# Patient Record
Sex: Male | Born: 1965 | Race: White | Hispanic: No | Marital: Single | Smoking: Current every day smoker
Health system: Southern US, Community
[De-identification: ages and names within clinical notes are randomized; demographics above are authoritative.]

## PROBLEM LIST (undated history)

## (undated) DIAGNOSIS — E039 Hypothyroidism, unspecified: Secondary | ICD-10-CM

## (undated) DIAGNOSIS — E119 Type 2 diabetes mellitus without complications: Secondary | ICD-10-CM

## (undated) DIAGNOSIS — K760 Fatty (change of) liver, not elsewhere classified: Secondary | ICD-10-CM

## (undated) DIAGNOSIS — M545 Low back pain, unspecified: Secondary | ICD-10-CM

## (undated) DIAGNOSIS — Z8719 Personal history of other diseases of the digestive system: Secondary | ICD-10-CM

## (undated) DIAGNOSIS — F410 Panic disorder [episodic paroxysmal anxiety] without agoraphobia: Secondary | ICD-10-CM

## (undated) DIAGNOSIS — K649 Unspecified hemorrhoids: Secondary | ICD-10-CM

## (undated) DIAGNOSIS — S52502A Unspecified fracture of the lower end of left radius, initial encounter for closed fracture: Secondary | ICD-10-CM

## (undated) DIAGNOSIS — E559 Vitamin D deficiency, unspecified: Secondary | ICD-10-CM

## (undated) DIAGNOSIS — J329 Chronic sinusitis, unspecified: Secondary | ICD-10-CM

## (undated) DIAGNOSIS — N400 Enlarged prostate without lower urinary tract symptoms: Secondary | ICD-10-CM

## (undated) DIAGNOSIS — Z903 Acquired absence of stomach [part of]: Secondary | ICD-10-CM

## (undated) DIAGNOSIS — D649 Anemia, unspecified: Secondary | ICD-10-CM

## (undated) DIAGNOSIS — E7800 Pure hypercholesterolemia, unspecified: Secondary | ICD-10-CM

## (undated) DIAGNOSIS — F329 Major depressive disorder, single episode, unspecified: Secondary | ICD-10-CM

## (undated) DIAGNOSIS — T8859XA Other complications of anesthesia, initial encounter: Secondary | ICD-10-CM

## (undated) DIAGNOSIS — T4145XA Adverse effect of unspecified anesthetic, initial encounter: Secondary | ICD-10-CM

## (undated) DIAGNOSIS — F32A Depression, unspecified: Secondary | ICD-10-CM

## (undated) DIAGNOSIS — J302 Other seasonal allergic rhinitis: Secondary | ICD-10-CM

## (undated) DIAGNOSIS — K912 Postsurgical malabsorption, not elsewhere classified: Secondary | ICD-10-CM

## (undated) DIAGNOSIS — E669 Obesity, unspecified: Secondary | ICD-10-CM

## (undated) DIAGNOSIS — G4730 Sleep apnea, unspecified: Secondary | ICD-10-CM

## (undated) DIAGNOSIS — R748 Abnormal levels of other serum enzymes: Secondary | ICD-10-CM

## (undated) DIAGNOSIS — E8809 Other disorders of plasma-protein metabolism, not elsewhere classified: Secondary | ICD-10-CM

## (undated) DIAGNOSIS — K219 Gastro-esophageal reflux disease without esophagitis: Secondary | ICD-10-CM

## (undated) HISTORY — PX: HERNIA REPAIR: SHX51

## (undated) HISTORY — PX: CHOLECYSTECTOMY: SHX55

## (undated) HISTORY — PX: COLONOSCOPY W/ BIOPSIES AND POLYPECTOMY: SHX1376

## (undated) HISTORY — PX: GASTRIC BYPASS: SHX52

## (undated) HISTORY — PX: MULTIPLE TOOTH EXTRACTIONS: SHX2053

---

## 2011-04-18 ENCOUNTER — Other Ambulatory Visit (HOSPITAL_COMMUNITY): Payer: Self-pay | Admitting: Rehabilitation

## 2011-04-18 DIAGNOSIS — M549 Dorsalgia, unspecified: Secondary | ICD-10-CM

## 2011-05-05 ENCOUNTER — Ambulatory Visit (HOSPITAL_COMMUNITY)
Admission: RE | Admit: 2011-05-05 | Discharge: 2011-05-05 | Disposition: A | Discharge status: Home / Self Care | Payer: BC Managed Care – PPO | Source: Ambulatory Visit | Attending: Rehabilitation | Admitting: Rehabilitation

## 2011-05-05 DIAGNOSIS — M549 Dorsalgia, unspecified: Secondary | ICD-10-CM

## 2011-05-19 ENCOUNTER — Ambulatory Visit (HOSPITAL_COMMUNITY)
Admission: RE | Admit: 2011-05-19 | Discharge: 2011-05-19 | Disposition: A | Discharge status: Home / Self Care | Payer: BC Managed Care – PPO | Source: Ambulatory Visit | Attending: Rehabilitation | Admitting: Rehabilitation

## 2011-05-19 DIAGNOSIS — M47817 Spondylosis without myelopathy or radiculopathy, lumbosacral region: Secondary | ICD-10-CM | POA: Insufficient documentation

## 2011-05-19 DIAGNOSIS — M79609 Pain in unspecified limb: Secondary | ICD-10-CM | POA: Insufficient documentation

## 2011-05-19 DIAGNOSIS — M545 Low back pain, unspecified: Secondary | ICD-10-CM | POA: Insufficient documentation

## 2011-05-19 DIAGNOSIS — M5126 Other intervertebral disc displacement, lumbar region: Secondary | ICD-10-CM | POA: Insufficient documentation

## 2011-05-19 IMAGING — XA IR EPIDUROGRAPHY
1 series · 5 of 5 positions shown · non-contrast
Comparison: none

CLINICAL DATA: Lumbosacral spondylosis without myelopathy.  Low
back and left lower extremity pain.  MR demonstrates protrusions
L4, L5 and L5 to S1.

[Series 300: ir epidurography · 5 of 5 slices shown]
[im 1/5]
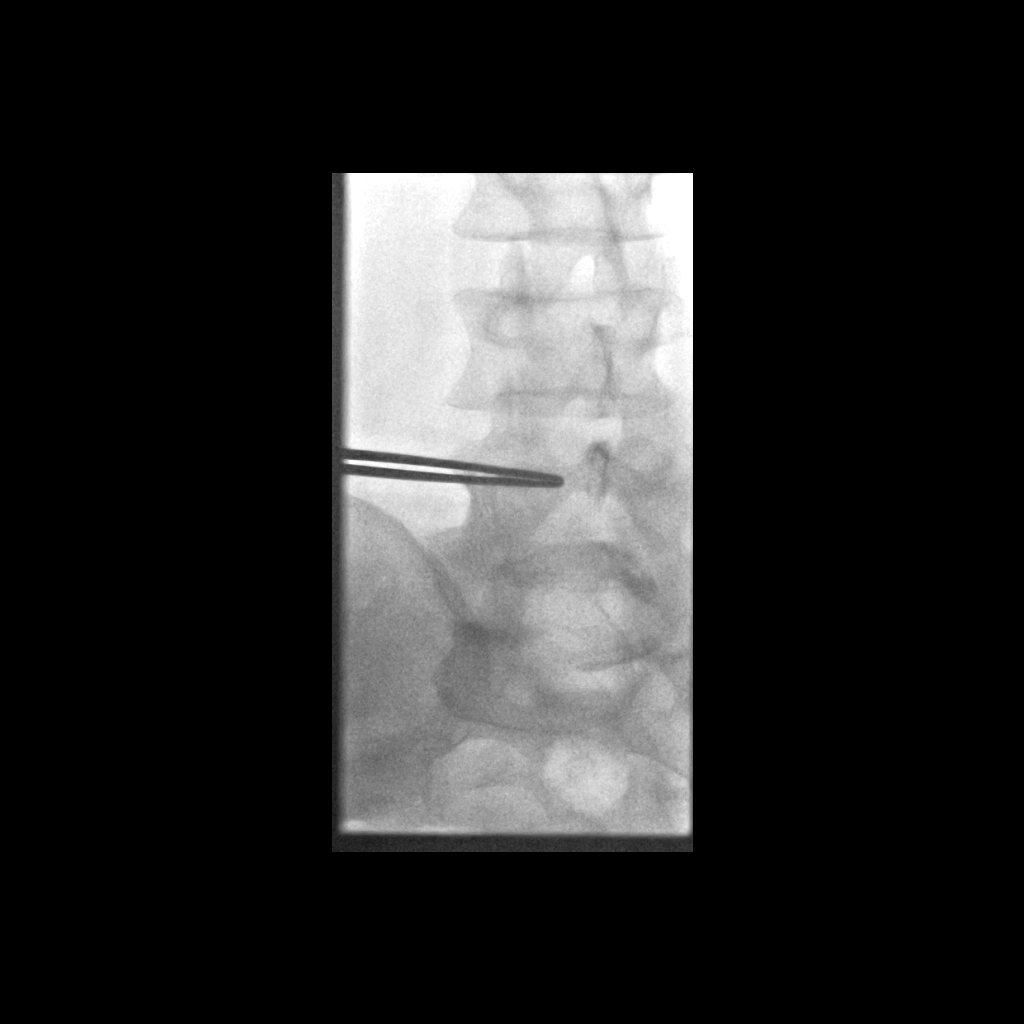
[im 2/5]
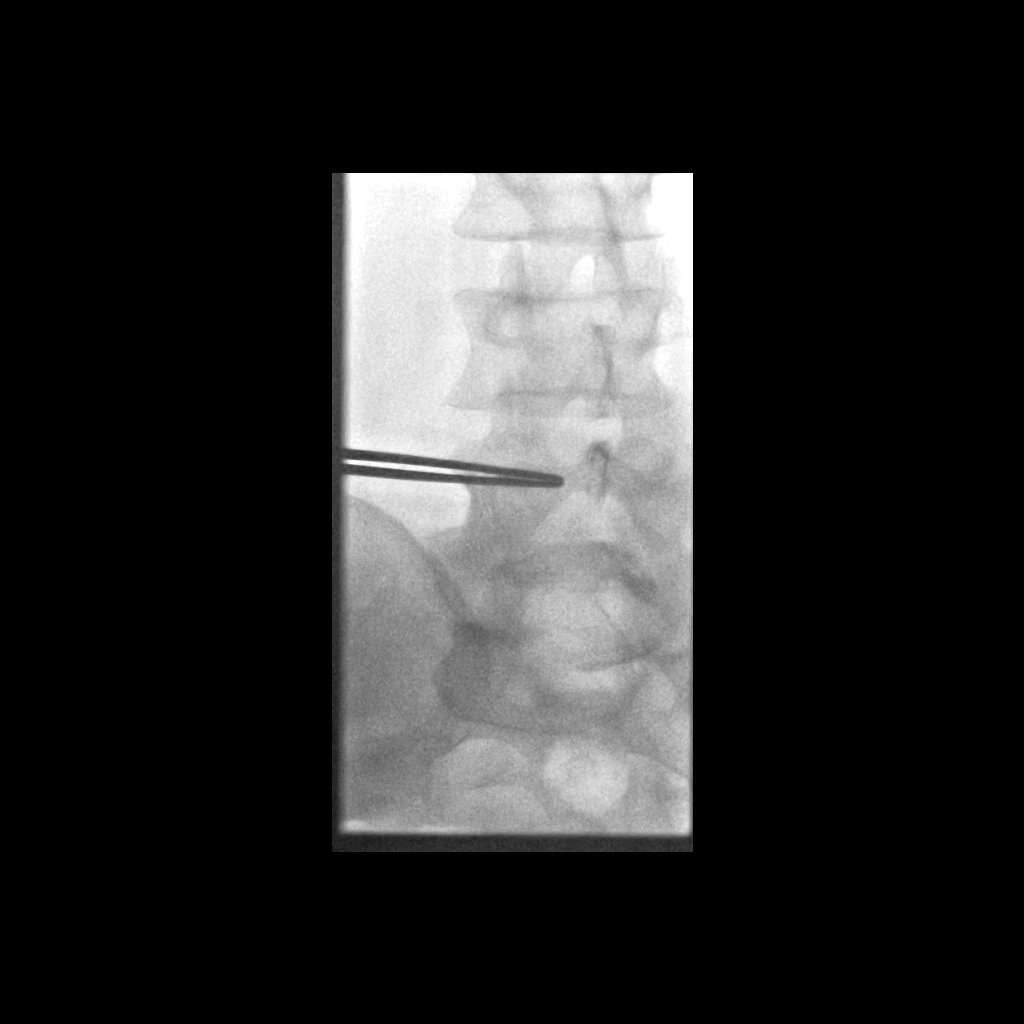
[im 3/5]
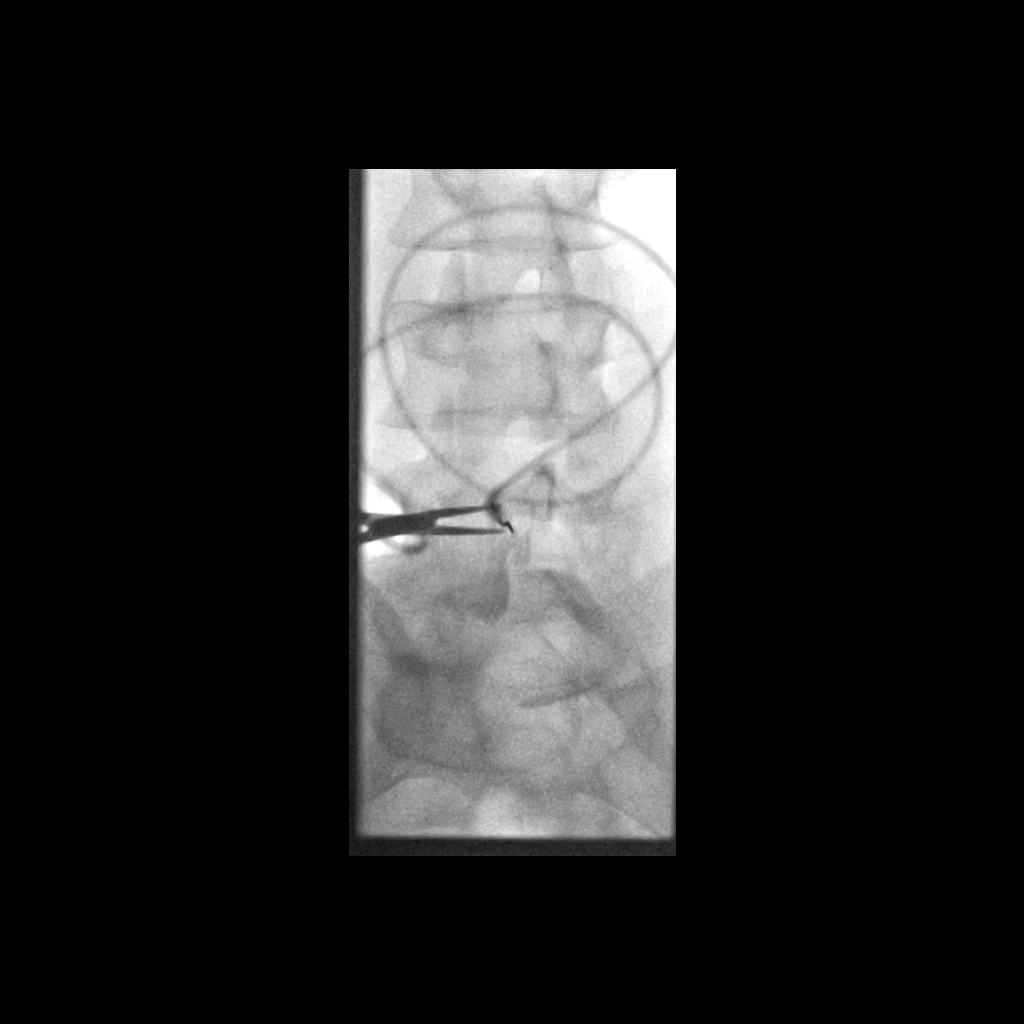
[im 4/5]
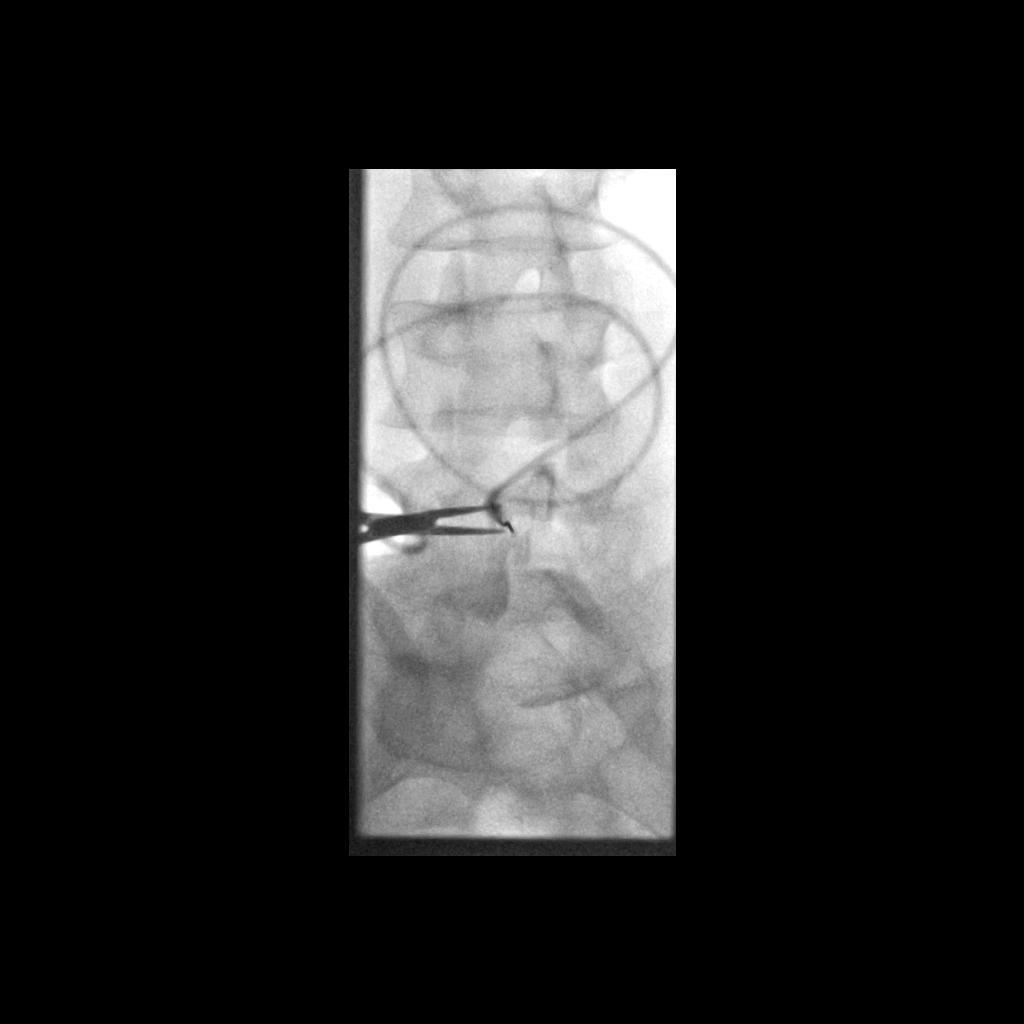
[im 5/5]
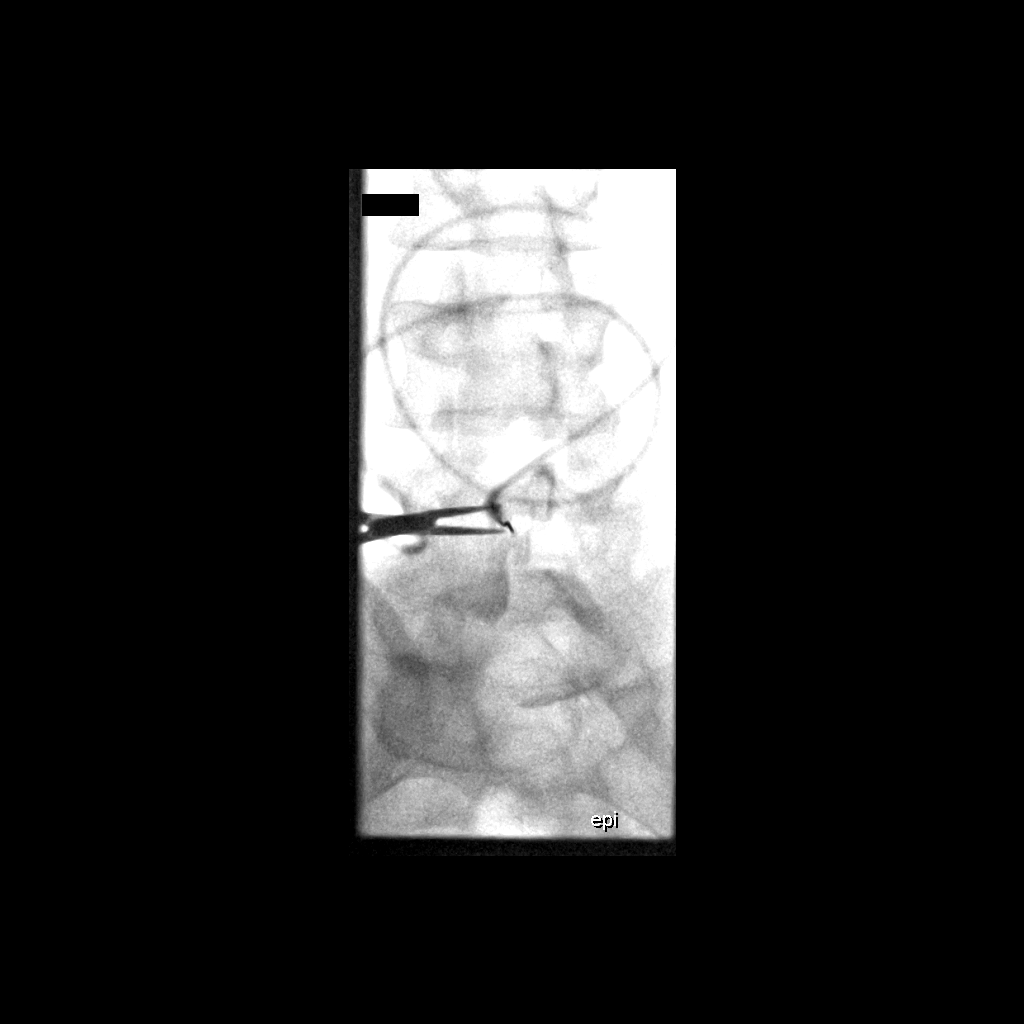

[5 of 5 positions shown; findings below may reference images not displayed]

Procedure: The procedure, risks, benefits, and alternatives were
explained to the patient. Questions regarding the procedure were
encouraged and answered. The patient understands and consents to
the procedure.

LUMBAR EPIDURAL INJECTION: An interlaminar approach was performed
on the left at L4-5.  Operator donned sterile gloves and mask. The
overlying skin was cleansed and anesthetized.  A 20 gauge Crawford
epidural needle was advanced using loss-of-resistance technique.

DIAGNOSTIC EPIDURAL INJECTION: Injection of Omnipaque 180 shows a
good epidural pattern with spread above and below the level of
needle placement. No intrathecal or vascular opacification is seen.

THERAPEUTIC EPIDURAL INJECTION: 120mg of Depo-Medrol mixed with 5ml
lidocaine 1% were instilled.  The procedure was well-tolerated, and
the patient was discharged thirty minutes following the injection
in good condition.

Fluoroscopy Time: 0.5 minutes

Complications: none
IMPRESSION: Technically successful epidural injection on the left
at L4-5.

## 2011-05-19 MED ORDER — IOHEXOL 180 MG/ML  SOLN
20.0000 mL | Freq: Once | INTRAMUSCULAR | Status: AC | PRN
  Administered 2011-05-19: 3 mL via INTRAVENOUS

## 2011-06-06 ENCOUNTER — Other Ambulatory Visit (HOSPITAL_COMMUNITY): Payer: Self-pay | Admitting: Rehabilitation

## 2011-06-10 ENCOUNTER — Other Ambulatory Visit (HOSPITAL_COMMUNITY): Payer: Self-pay | Admitting: Rehabilitation

## 2011-06-10 DIAGNOSIS — M79606 Pain in leg, unspecified: Secondary | ICD-10-CM

## 2011-06-30 ENCOUNTER — Ambulatory Visit (HOSPITAL_COMMUNITY): Admission: RE | Admit: 2011-06-30 | Payer: BC Managed Care – PPO | Source: Ambulatory Visit

## 2017-07-20 ENCOUNTER — Encounter (HOSPITAL_COMMUNITY): Payer: Self-pay | Admitting: *Deleted

## 2017-07-20 ENCOUNTER — Ambulatory Visit: Payer: Self-pay | Admitting: Orthopedic Surgery

## 2017-07-20 NOTE — Progress Notes (Signed)
Pt denies SOB, chest pain, and being under the care of a cardiologist. Pt denies having a cardiac cath but stated that a stress test was performed 10 years ago. Requested echo report from [facility] (in Care Everywhere). Pt made aware to stop taking  Aspirin, vitamins, fish oil, Biotin and herbal medications . Do not take any NSAIDs ie: Ibuprofen, Advil, Naproxen (Aleve), Motrin, BC and Goody Powder or any medication containing Aspirin. Spoke with Lawson Fiscal, Surgical Coordinator to make MD aware that pt has a Penicillin allergy and Ancef is ordered. Pt verbalized understanding of all pre-op instructions.

## 2017-07-20 NOTE — Progress Notes (Signed)
Dr. Amanda Pea called to clarify allergy to ancef and stated that if it's listed as a low severity, continue with Ancef.

## 2017-07-21 ENCOUNTER — Encounter (HOSPITAL_COMMUNITY): Payer: Self-pay | Admitting: *Deleted

## 2017-07-21 ENCOUNTER — Encounter (HOSPITAL_COMMUNITY)
Admission: RE | Disposition: A | Discharge status: Home / Self Care | Payer: Self-pay | Source: Ambulatory Visit | Attending: Orthopedic Surgery

## 2017-07-21 ENCOUNTER — Ambulatory Visit (HOSPITAL_COMMUNITY): Payer: Worker's Compensation | Admitting: Certified Registered"

## 2017-07-21 ENCOUNTER — Observation Stay (HOSPITAL_COMMUNITY)
Admission: RE | Admit: 2017-07-21 | Discharge: 2017-07-22 | Disposition: A | Discharge status: Home / Self Care | Payer: Worker's Compensation | Source: Ambulatory Visit | Attending: Orthopedic Surgery | Admitting: Orthopedic Surgery

## 2017-07-21 DIAGNOSIS — F17210 Nicotine dependence, cigarettes, uncomplicated: Secondary | ICD-10-CM | POA: Insufficient documentation

## 2017-07-21 DIAGNOSIS — K760 Fatty (change of) liver, not elsewhere classified: Secondary | ICD-10-CM | POA: Insufficient documentation

## 2017-07-21 DIAGNOSIS — S52572A Other intraarticular fracture of lower end of left radius, initial encounter for closed fracture: Principal | ICD-10-CM | POA: Insufficient documentation

## 2017-07-21 DIAGNOSIS — Z9884 Bariatric surgery status: Secondary | ICD-10-CM | POA: Diagnosis not present

## 2017-07-21 DIAGNOSIS — E119 Type 2 diabetes mellitus without complications: Secondary | ICD-10-CM | POA: Insufficient documentation

## 2017-07-21 DIAGNOSIS — Z6841 Body Mass Index (BMI) 40.0 and over, adult: Secondary | ICD-10-CM | POA: Diagnosis not present

## 2017-07-21 DIAGNOSIS — X58XXXA Exposure to other specified factors, initial encounter: Secondary | ICD-10-CM | POA: Diagnosis not present

## 2017-07-21 DIAGNOSIS — G4730 Sleep apnea, unspecified: Secondary | ICD-10-CM | POA: Diagnosis not present

## 2017-07-21 DIAGNOSIS — Z880 Allergy status to penicillin: Secondary | ICD-10-CM | POA: Insufficient documentation

## 2017-07-21 DIAGNOSIS — S52502A Unspecified fracture of the lower end of left radius, initial encounter for closed fracture: Secondary | ICD-10-CM | POA: Diagnosis present

## 2017-07-21 DIAGNOSIS — K219 Gastro-esophageal reflux disease without esophagitis: Secondary | ICD-10-CM | POA: Insufficient documentation

## 2017-07-21 DIAGNOSIS — E039 Hypothyroidism, unspecified: Secondary | ICD-10-CM | POA: Diagnosis not present

## 2017-07-21 DIAGNOSIS — S52602A Unspecified fracture of lower end of left ulna, initial encounter for closed fracture: Secondary | ICD-10-CM | POA: Diagnosis not present

## 2017-07-21 DIAGNOSIS — E7800 Pure hypercholesterolemia, unspecified: Secondary | ICD-10-CM | POA: Insufficient documentation

## 2017-07-21 DIAGNOSIS — Z79899 Other long term (current) drug therapy: Secondary | ICD-10-CM | POA: Insufficient documentation

## 2017-07-21 DIAGNOSIS — N400 Enlarged prostate without lower urinary tract symptoms: Secondary | ICD-10-CM | POA: Insufficient documentation

## 2017-07-21 HISTORY — DX: Gastro-esophageal reflux disease without esophagitis: K21.9

## 2017-07-21 HISTORY — DX: Personal history of other diseases of the digestive system: Z87.19

## 2017-07-21 HISTORY — DX: Acquired absence of stomach (part of): Z90.3

## 2017-07-21 HISTORY — DX: Hypothyroidism, unspecified: E03.9

## 2017-07-21 HISTORY — DX: Depression, unspecified: F32.A

## 2017-07-21 HISTORY — DX: Low back pain, unspecified: M54.50

## 2017-07-21 HISTORY — DX: Anemia, unspecified: D64.9

## 2017-07-21 HISTORY — DX: Unspecified fracture of the lower end of left radius, initial encounter for closed fracture: S52.502A

## 2017-07-21 HISTORY — DX: Chronic sinusitis, unspecified: J32.9

## 2017-07-21 HISTORY — DX: Vitamin D deficiency, unspecified: E55.9

## 2017-07-21 HISTORY — DX: Panic disorder (episodic paroxysmal anxiety): F41.0

## 2017-07-21 HISTORY — DX: Benign prostatic hyperplasia without lower urinary tract symptoms: N40.0

## 2017-07-21 HISTORY — DX: Other complications of anesthesia, initial encounter: T88.59XA

## 2017-07-21 HISTORY — DX: Adverse effect of unspecified anesthetic, initial encounter: T41.45XA

## 2017-07-21 HISTORY — DX: Low back pain: M54.5

## 2017-07-21 HISTORY — DX: Unspecified hemorrhoids: K64.9

## 2017-07-21 HISTORY — DX: Major depressive disorder, single episode, unspecified: F32.9

## 2017-07-21 HISTORY — DX: Pure hypercholesterolemia, unspecified: E78.00

## 2017-07-21 HISTORY — DX: Other disorders of plasma-protein metabolism, not elsewhere classified: E88.09

## 2017-07-21 HISTORY — PX: WRIST SURGERY: SHX841

## 2017-07-21 HISTORY — DX: Other seasonal allergic rhinitis: J30.2

## 2017-07-21 HISTORY — DX: Obesity, unspecified: E66.9

## 2017-07-21 HISTORY — DX: Fatty (change of) liver, not elsewhere classified: K76.0

## 2017-07-21 HISTORY — DX: Postsurgical malabsorption, not elsewhere classified: K91.2

## 2017-07-21 HISTORY — DX: Type 2 diabetes mellitus without complications: E11.9

## 2017-07-21 HISTORY — DX: Abnormal levels of other serum enzymes: R74.8

## 2017-07-21 HISTORY — DX: Sleep apnea, unspecified: G47.30

## 2017-07-21 HISTORY — PX: OPEN REDUCTION INTERNAL FIXATION (ORIF) DISTAL RADIAL FRACTURE: SHX5989

## 2017-07-21 LAB — CBC
HEMATOCRIT: 38.2 % — AB (ref 39.0–52.0)
HEMOGLOBIN: 12.8 g/dL — AB (ref 13.0–17.0)
MCH: 30.8 pg (ref 26.0–34.0)
MCHC: 33.5 g/dL (ref 30.0–36.0)
MCV: 92 fL (ref 78.0–100.0)
PLATELETS: 231 10*3/uL (ref 150–400)
RBC: 4.15 MIL/uL — AB (ref 4.22–5.81)
RDW: 12.9 % (ref 11.5–15.5)
WBC: 6.5 10*3/uL (ref 4.0–10.5)

## 2017-07-21 LAB — COMPREHENSIVE METABOLIC PANEL
ALT: 29 U/L (ref 17–63)
ANION GAP: 8 (ref 5–15)
AST: 27 U/L (ref 15–41)
Albumin: 2.9 g/dL — ABNORMAL LOW (ref 3.5–5.0)
Alkaline Phosphatase: 186 U/L — ABNORMAL HIGH (ref 38–126)
BILIRUBIN TOTAL: 0.6 mg/dL (ref 0.3–1.2)
BUN: <5 mg/dL — ABNORMAL LOW (ref 6–20)
CO2: 24 mmol/L (ref 22–32)
Calcium: 8.3 mg/dL — ABNORMAL LOW (ref 8.9–10.3)
Chloride: 106 mmol/L (ref 101–111)
Creatinine, Ser: 0.94 mg/dL (ref 0.61–1.24)
Glucose, Bld: 86 mg/dL (ref 65–99)
POTASSIUM: 3.8 mmol/L (ref 3.5–5.1)
Sodium: 138 mmol/L (ref 135–145)
TOTAL PROTEIN: 5.2 g/dL — AB (ref 6.5–8.1)

## 2017-07-21 LAB — GLUCOSE, CAPILLARY
GLUCOSE-CAPILLARY: 83 mg/dL (ref 65–99)
Glucose-Capillary: 81 mg/dL (ref 65–99)

## 2017-07-21 LAB — PROTIME-INR
INR: 1.03
Prothrombin Time: 13.4 seconds (ref 11.4–15.2)

## 2017-07-21 SURGERY — OPEN REDUCTION INTERNAL FIXATION (ORIF) DISTAL RADIUS FRACTURE
Anesthesia: General | Site: Arm Lower | Laterality: Left

## 2017-07-21 MED ORDER — PROPOFOL 500 MG/50ML IV EMUL
INTRAVENOUS | Status: DC | PRN
  Administered 2017-07-21: 100 ug/kg/min via INTRAVENOUS

## 2017-07-21 MED ORDER — CEFAZOLIN SODIUM-DEXTROSE 2-4 GM/100ML-% IV SOLN
INTRAVENOUS | Status: AC
  Filled 2017-07-21: qty 100

## 2017-07-21 MED ORDER — PROPOFOL 10 MG/ML IV BOLUS
INTRAVENOUS | Status: AC
  Filled 2017-07-21: qty 20

## 2017-07-21 MED ORDER — LACTATED RINGERS IV SOLN
INTRAVENOUS | Status: DC
  Administered 2017-07-21: 14:00:00 via INTRAVENOUS

## 2017-07-21 MED ORDER — FENTANYL CITRATE (PF) 100 MCG/2ML IJ SOLN
INTRAMUSCULAR | Status: AC
  Administered 2017-07-21: 100 ug via INTRAVENOUS
  Filled 2017-07-21: qty 2

## 2017-07-21 MED ORDER — FUROSEMIDE 40 MG PO TABS: 40.0000 mg | ORAL_TABLET | Freq: Every day | ORAL | Status: DC | PRN

## 2017-07-21 MED ORDER — MIDAZOLAM HCL 2 MG/2ML IJ SOLN
INTRAMUSCULAR | Status: AC
  Administered 2017-07-21: 2 mg via INTRAVENOUS
  Filled 2017-07-21: qty 2

## 2017-07-21 MED ORDER — MORPHINE SULFATE (PF) 4 MG/ML IV SOLN
1.0000 mg | INTRAVENOUS | Status: DC | PRN
  Administered 2017-07-22: 1 mg via INTRAVENOUS
  Filled 2017-07-21: qty 1

## 2017-07-21 MED ORDER — VITAMIN B-12 1000 MCG PO TABS
1000.0000 ug | ORAL_TABLET | Freq: Every day | ORAL | Status: DC
  Administered 2017-07-22: 1000 ug via ORAL
  Filled 2017-07-21: qty 1

## 2017-07-21 MED ORDER — ONDANSETRON HCL 4 MG/2ML IJ SOLN
4.0000 mg | Freq: Four times a day (QID) | INTRAMUSCULAR | Status: DC | PRN
  Administered 2017-07-22: 4 mg via INTRAVENOUS
  Filled 2017-07-21: qty 2

## 2017-07-21 MED ORDER — PANTOPRAZOLE SODIUM 40 MG PO TBEC
40.0000 mg | DELAYED_RELEASE_TABLET | Freq: Two times a day (BID) | ORAL | Status: DC
  Administered 2017-07-22: 40 mg via ORAL
  Filled 2017-07-21: qty 1

## 2017-07-21 MED ORDER — METHOCARBAMOL 1000 MG/10ML IJ SOLN
500.0000 mg | Freq: Four times a day (QID) | INTRAVENOUS | Status: DC | PRN
  Filled 2017-07-21: qty 5

## 2017-07-21 MED ORDER — METOCLOPRAMIDE HCL 5 MG/ML IJ SOLN: 10.0000 mg | Freq: Once | INTRAMUSCULAR | Status: DC | PRN

## 2017-07-21 MED ORDER — BUPROPION HCL ER (SR) 150 MG PO TB12
150.0000 mg | ORAL_TABLET | Freq: Two times a day (BID) | ORAL | Status: DC
  Administered 2017-07-21 – 2017-07-22 (×2): 150 mg via ORAL
  Filled 2017-07-21 (×2): qty 1

## 2017-07-21 MED ORDER — MIDAZOLAM HCL 2 MG/2ML IJ SOLN
2.0000 mg | Freq: Once | INTRAMUSCULAR | Status: AC
  Administered 2017-07-21: 2 mg via INTRAVENOUS

## 2017-07-21 MED ORDER — AMITRIPTYLINE HCL 50 MG PO TABS
75.0000 mg | ORAL_TABLET | Freq: Every day | ORAL | Status: DC
  Administered 2017-07-21: 75 mg via ORAL
  Filled 2017-07-21: qty 1

## 2017-07-21 MED ORDER — OXYCODONE HCL 5 MG PO TABS
5.0000 mg | ORAL_TABLET | ORAL | Status: DC | PRN
  Administered 2017-07-22 (×5): 10 mg via ORAL
  Filled 2017-07-21 (×5): qty 2

## 2017-07-21 MED ORDER — MIDAZOLAM HCL 2 MG/2ML IJ SOLN: 0.5000 mg | Freq: Once | INTRAMUSCULAR | Status: DC

## 2017-07-21 MED ORDER — MIDAZOLAM HCL 5 MG/5ML IJ SOLN
INTRAMUSCULAR | Status: DC | PRN
  Administered 2017-07-21: 2 mg via INTRAVENOUS

## 2017-07-21 MED ORDER — CEFAZOLIN SODIUM-DEXTROSE 2-4 GM/100ML-% IV SOLN: 2.0000 g | INTRAVENOUS | Status: DC

## 2017-07-21 MED ORDER — LACTATED RINGERS IV SOLN: INTRAVENOUS | Status: DC

## 2017-07-21 MED ORDER — GABAPENTIN 300 MG PO CAPS
900.0000 mg | ORAL_CAPSULE | Freq: Two times a day (BID) | ORAL | Status: DC
  Administered 2017-07-21 – 2017-07-22 (×2): 900 mg via ORAL
  Filled 2017-07-21 (×2): qty 3

## 2017-07-21 MED ORDER — SENNA 8.6 MG PO TABS
1.0000 | ORAL_TABLET | Freq: Two times a day (BID) | ORAL | Status: DC
  Administered 2017-07-21 – 2017-07-22 (×2): 8.6 mg via ORAL
  Filled 2017-07-21 (×2): qty 1

## 2017-07-21 MED ORDER — CHLORHEXIDINE GLUCONATE 4 % EX LIQD: 60.0000 mL | Freq: Once | CUTANEOUS | Status: DC

## 2017-07-21 MED ORDER — VANCOMYCIN HCL 10 G IV SOLR
1250.0000 mg | Freq: Two times a day (BID) | INTRAVENOUS | Status: DC
  Administered 2017-07-21 – 2017-07-22 (×2): 1250 mg via INTRAVENOUS
  Filled 2017-07-21 (×2): qty 1250

## 2017-07-21 MED ORDER — 0.9 % SODIUM CHLORIDE (POUR BTL) OPTIME
TOPICAL | Status: DC | PRN
  Administered 2017-07-21: 1000 mL

## 2017-07-21 MED ORDER — MIDAZOLAM HCL 2 MG/2ML IJ SOLN
INTRAMUSCULAR | Status: AC
  Filled 2017-07-21: qty 2

## 2017-07-21 MED ORDER — ONDANSETRON HCL 4 MG/2ML IJ SOLN
INTRAMUSCULAR | Status: AC
  Filled 2017-07-21: qty 2

## 2017-07-21 MED ORDER — PHENYLEPHRINE 40 MCG/ML (10ML) SYRINGE FOR IV PUSH (FOR BLOOD PRESSURE SUPPORT)
PREFILLED_SYRINGE | INTRAVENOUS | Status: AC
  Filled 2017-07-21: qty 10

## 2017-07-21 MED ORDER — ROPIVACAINE HCL 5 MG/ML IJ SOLN
INTRAMUSCULAR | Status: DC | PRN
  Administered 2017-07-21: 30 mL via PERINEURAL

## 2017-07-21 MED ORDER — FENTANYL CITRATE (PF) 100 MCG/2ML IJ SOLN: 25.0000 ug | INTRAMUSCULAR | Status: DC | PRN

## 2017-07-21 MED ORDER — DULOXETINE HCL 60 MG PO CPEP
60.0000 mg | ORAL_CAPSULE | Freq: Every day | ORAL | Status: DC
Start: 2017-07-22 — End: 2017-07-22
  Filled 2017-07-21: qty 1

## 2017-07-21 MED ORDER — DEXTROSE 5 % IV SOLN
3.0000 g | INTRAVENOUS | Status: AC
  Administered 2017-07-21: 3 g via INTRAVENOUS
  Filled 2017-07-21: qty 3000

## 2017-07-21 MED ORDER — MEPERIDINE HCL 25 MG/ML IJ SOLN: 6.2500 mg | INTRAMUSCULAR | Status: DC | PRN

## 2017-07-21 MED ORDER — FENTANYL CITRATE (PF) 100 MCG/2ML IJ SOLN
100.0000 ug | Freq: Once | INTRAMUSCULAR | Status: AC
Start: 2017-07-21 — End: 2017-07-21
  Administered 2017-07-21: 100 ug via INTRAVENOUS

## 2017-07-21 MED ORDER — PROMETHAZINE HCL 25 MG RE SUPP: 12.5000 mg | Freq: Four times a day (QID) | RECTAL | Status: DC | PRN

## 2017-07-21 MED ORDER — METHOCARBAMOL 500 MG PO TABS
500.0000 mg | ORAL_TABLET | Freq: Four times a day (QID) | ORAL | Status: DC | PRN
  Administered 2017-07-22 (×2): 500 mg via ORAL
  Filled 2017-07-21 (×2): qty 1

## 2017-07-21 MED ORDER — FENTANYL CITRATE (PF) 250 MCG/5ML IJ SOLN
INTRAMUSCULAR | Status: AC
  Filled 2017-07-21: qty 5

## 2017-07-21 MED ORDER — BUPIVACAINE HCL (PF) 0.25 % IJ SOLN
INTRAMUSCULAR | Status: AC
  Filled 2017-07-21: qty 30

## 2017-07-21 MED ORDER — ONDANSETRON HCL 4 MG PO TABS: 4.0000 mg | ORAL_TABLET | Freq: Four times a day (QID) | ORAL | Status: DC | PRN

## 2017-07-21 MED ORDER — LORATADINE 10 MG PO TABS
10.0000 mg | ORAL_TABLET | Freq: Every day | ORAL | Status: DC
  Administered 2017-07-21 – 2017-07-22 (×2): 10 mg via ORAL
  Filled 2017-07-21 (×2): qty 1

## 2017-07-21 MED ORDER — DEXTROSE 5 % IV SOLN
3.0000 g | INTRAVENOUS | Status: DC
  Filled 2017-07-21: qty 3000

## 2017-07-21 MED ORDER — LACTATED RINGERS IV SOLN
INTRAVENOUS | Status: DC
  Administered 2017-07-22: 03:00:00 via INTRAVENOUS

## 2017-07-21 MED ORDER — ZOLPIDEM TARTRATE 5 MG PO TABS: 5.0000 mg | ORAL_TABLET | Freq: Every evening | ORAL | Status: DC | PRN

## 2017-07-21 MED ORDER — FERROUS SULFATE 325 (65 FE) MG PO TABS
325.0000 mg | ORAL_TABLET | Freq: Every day | ORAL | Status: DC
  Administered 2017-07-22: 325 mg via ORAL
  Filled 2017-07-21: qty 1

## 2017-07-21 SURGICAL SUPPLY — 66 items
BANDAGE ACE 3X5.8 VEL STRL LF (GAUZE/BANDAGES/DRESSINGS) ×3 IMPLANT
BANDAGE ACE 4X5 VEL STRL LF (GAUZE/BANDAGES/DRESSINGS) ×6 IMPLANT
BIT DRILL 2.2 SS TIBIAL (BIT) ×3 IMPLANT
BLADE CLIPPER SURG (BLADE) IMPLANT
BNDG ESMARK 4X9 LF (GAUZE/BANDAGES/DRESSINGS) ×3 IMPLANT
BNDG GAUZE ELAST 4 BULKY (GAUZE/BANDAGES/DRESSINGS) ×3 IMPLANT
CANISTER SUCTION WELLS/JOHNSON (MISCELLANEOUS) ×3 IMPLANT
CORDS BIPOLAR (ELECTRODE) ×3 IMPLANT
COVER SURGICAL LIGHT HANDLE (MISCELLANEOUS) ×3 IMPLANT
CUFF TOURNIQUET SINGLE 18IN (TOURNIQUET CUFF) ×3 IMPLANT
CUFF TOURNIQUET SINGLE 24IN (TOURNIQUET CUFF) IMPLANT
DECANTER SPIKE VIAL GLASS SM (MISCELLANEOUS) IMPLANT
DRAIN TLS ROUND 10FR (DRAIN) IMPLANT
DRAPE OEC MINIVIEW 54X84 (DRAPES) ×3 IMPLANT
DRAPE U-SHAPE 47X51 STRL (DRAPES) ×3 IMPLANT
DRSG ADAPTIC 3X8 NADH LF (GAUZE/BANDAGES/DRESSINGS) ×3 IMPLANT
GAUZE SPONGE 4X4 12PLY STRL (GAUZE/BANDAGES/DRESSINGS) ×3 IMPLANT
GAUZE XEROFORM 5X9 LF (GAUZE/BANDAGES/DRESSINGS) ×3 IMPLANT
GLOVE BIOGEL M 8.0 STRL (GLOVE) ×3 IMPLANT
GLOVE SS BIOGEL STRL SZ 8 (GLOVE) ×1 IMPLANT
GLOVE SUPERSENSE BIOGEL SZ 8 (GLOVE) ×2
GOWN STRL REUS W/ TWL LRG LVL3 (GOWN DISPOSABLE) ×1 IMPLANT
GOWN STRL REUS W/ TWL XL LVL3 (GOWN DISPOSABLE) ×3 IMPLANT
GOWN STRL REUS W/TWL LRG LVL3 (GOWN DISPOSABLE) ×2
GOWN STRL REUS W/TWL XL LVL3 (GOWN DISPOSABLE) ×6
HOVERMATT SINGLE USE (MISCELLANEOUS) ×3 IMPLANT
K-WIRE 1.6 (WIRE) ×2
K-WIRE FX5X1.6XNS BN SS (WIRE) ×1
KIT BASIN OR (CUSTOM PROCEDURE TRAY) ×3 IMPLANT
KIT ROOM TURNOVER OR (KITS) ×3 IMPLANT
KWIRE FX5X1.6XNS BN SS (WIRE) ×1 IMPLANT
MANIFOLD NEPTUNE II (INSTRUMENTS) IMPLANT
NEEDLE 22X1 1/2 (OR ONLY) (NEEDLE) IMPLANT
NS IRRIG 1000ML POUR BTL (IV SOLUTION) ×3 IMPLANT
PACK ORTHO EXTREMITY (CUSTOM PROCEDURE TRAY) ×3 IMPLANT
PAD ARMBOARD 7.5X6 YLW CONV (MISCELLANEOUS) ×6 IMPLANT
PAD CAST 3X4 CTTN HI CHSV (CAST SUPPLIES) ×1 IMPLANT
PAD CAST 4YDX4 CTTN HI CHSV (CAST SUPPLIES) ×2 IMPLANT
PADDING CAST COTTON 3X4 STRL (CAST SUPPLIES) ×2
PADDING CAST COTTON 4X4 STRL (CAST SUPPLIES) ×4
PEG LOCKING SMOOTH 2.2X20 (Screw) ×3 IMPLANT
PEG LOCKING SMOOTH 2.2X22 (Screw) ×12 IMPLANT
PLATE STD DVR LEFT (Plate) ×3 IMPLANT
PLATE STD DVR LT 24X55 (Plate) ×1 IMPLANT
SCREW LOCK 16X2.7X 3 LD TPR (Screw) ×4 IMPLANT
SCREW LOCK 18X2.7X 3 LD TPR (Screw) ×1 IMPLANT
SCREW LOCK 22X2.7X 3 LD TPR (Screw) ×2 IMPLANT
SCREW LOCKING 2.7X16 (Screw) ×8 IMPLANT
SCREW LOCKING 2.7X18 (Screw) ×2 IMPLANT
SCREW LOCKING 2.7X22MM (Screw) ×4 IMPLANT
SCRUB BETADINE 4OZ XXX (MISCELLANEOUS) ×3 IMPLANT
SOL PREP POV-IOD 4OZ 10% (MISCELLANEOUS) ×3 IMPLANT
SPONGE LAP 4X18 X RAY DECT (DISPOSABLE) IMPLANT
SUT MNCRL AB 4-0 PS2 18 (SUTURE) IMPLANT
SUT PROLENE 3 0 PS 2 (SUTURE) IMPLANT
SUT PROLENE 4 0 PS 2 18 (SUTURE) ×9 IMPLANT
SUT VIC AB 3-0 FS2 27 (SUTURE) IMPLANT
SUT VIC AB 4-0 PS2 18 (SUTURE) ×3 IMPLANT
SYR CONTROL 10ML LL (SYRINGE) IMPLANT
SYSTEM CHEST DRAIN TLS 7FR (DRAIN) ×3 IMPLANT
TOWEL OR 17X24 6PK STRL BLUE (TOWEL DISPOSABLE) ×3 IMPLANT
TOWEL OR 17X26 10 PK STRL BLUE (TOWEL DISPOSABLE) ×3 IMPLANT
TUBE CONNECTING 12'X1/4 (SUCTIONS) ×1
TUBE CONNECTING 12X1/4 (SUCTIONS) ×2 IMPLANT
TUBE EVACUATION TLS (MISCELLANEOUS) ×3 IMPLANT
UNDERPAD 30X30 (UNDERPADS AND DIAPERS) ×3 IMPLANT

## 2017-07-21 NOTE — Progress Notes (Signed)
Pharmacy Antibiotic Note  Nicholas Reid is a 51 y.o. male admitted on 07/21/2017 s/p hand surgery. Pharmacy has been consulted for vancomycin dosing.  Normalized CrCl ~78ml/min.  Plan: Start vancomycin 1,250mg  IV Q12h Monitor clinical picture, renal function, VT at Css F/U C&S, abx deescalation / LOT   Height:  (185.4 cm) Weight: (!) 320 lb (145.2 kg) IBW/kg (Calculated) : 79.9  Temp (24hrs), Avg:97.5 F (36.4 C), Min:97.2 F (36.2 C), Max:97.8 F (36.6 C)   Recent Labs Lab 07/21/17 1403  WBC 6.5  CREATININE 0.94    Estimated Creatinine Clearance: 136.3 mL/min (by C-G formula based on SCr of 0.94 mg/dL).    Allergies  Allergen Reactions  . Penicillins Rash    Thank you for allowing pharmacy to be a part of this patient's care.  Armandina Stammer 07/21/2017 9:43 PM

## 2017-07-21 NOTE — Op Note (Signed)
See op note Dictation#[id number] SP ORIF left DRF Siarah Deleo MD

## 2017-07-21 NOTE — Anesthesia Preprocedure Evaluation (Addendum)
Anesthesia Evaluation    Reviewed: Allergy & Precautions, Patient's Chart, lab work & pertinent test results  Airway Mallampati: II  TM Distance: >3 FB Neck ROM: Full    Dental no notable dental hx.    Pulmonary sleep apnea , Current Smoker,    Pulmonary exam normal breath sounds clear to auscultation       Cardiovascular hypertension, Normal cardiovascular exam Rhythm:Regular Rate:Normal     Neuro/Psych PSYCHIATRIC DISORDERS Anxiety Depression negative neurological ROS     GI/Hepatic Neg liver ROS, hiatal hernia, GERD  Medicated,  Endo/Other  diabetesHypothyroidism Morbid obesity  Renal/GU negative Renal ROS     Musculoskeletal negative musculoskeletal ROS (+)   Abdominal   Peds  Hematology negative hematology ROS (+)   Anesthesia Other Findings - HLD  Reproductive/Obstetrics                            Anesthesia Physical Anesthesia Plan  ASA: III  Anesthesia Plan: Regional   Post-op Pain Management:    Induction: Intravenous  PONV Risk Score and Plan: 0  Airway Management Planned:   Additional Equipment:   Intra-op Plan:   Post-operative Plan: Extubation in OR  Informed Consent: I have reviewed the patients History and Physical, chart, labs and discussed the procedure including the risks, benefits and alternatives for the proposed anesthesia with the patient or authorized representative who has indicated his/her understanding and acceptance.   Dental advisory given  Plan Discussed with: CRNA  Anesthesia Plan Comments:        Anesthesia Quick Evaluation

## 2017-07-21 NOTE — Anesthesia Procedure Notes (Signed)
Anesthesia Regional Block: Supraclavicular block   Pre-Anesthetic Checklist: ,, timeout performed, Correct Patient, Correct Site, Correct Laterality, Correct Procedure, Correct Position, site marked, Risks and benefits discussed,  Surgical consent,  Pre-op evaluation,  At surgeon's request and post-op pain management  Laterality: Left and Upper  Prep: Maximum Sterile Barrier Precautions used, chloraprep       Needles:  Injection technique: Single-shot  Needle Type: Echogenic Stimulator Needle     Needle Length: 10cm      Additional Needles:   Procedures:,,,, ultrasound used (permanent image in chart),,,,  Narrative:  Start time: 07/21/2017 5:36 PM End time: 07/21/2017 5:46 PM Injection made incrementally with aspirations every 5 mL.  Performed by: Personally  Anesthesiologist: Phillips Grout  Additional Notes: Risks, benefits and alternative to block explained extensively.  Patient tolerated procedure well, without complications.

## 2017-07-21 NOTE — H&P (Signed)
Nicholas Reid is an 51 y.o. male.   Chief Complaint: left radius fracture displaced HPI: Patient presents for evaluation and treatment of the of their upper extremity predicament. The patient denies neck, back, chest or  abdominal pain. The patient notes that they have no lower extremity problems. The patients primary complaint is noted. We are planning surgical care pathway for the upper extremity.  Past Medical History:  Diagnosis Date  . Abnormal liver enzymes    elevated AST  . Anemia   . BPH (benign prostatic hyperplasia)   . Complication of anesthesia    " When I wake up, I am really mad."  . Depression   . Diabetes mellitus without complication (HCC)    PMH  . Distal radius fracture, left   . Fatty liver    PMH  . GERD (gastroesophageal reflux disease)   . Hemorrhoids   . History of hiatal hernia    PMH  . Hypercholesterolemia   . Hypoalbuminemia   . Hypothyroidism   . Intestinal malabsorption following gastrectomy   . Low back pain   . Obesity   . Panic attack   . Seasonal allergies   . Sinusitis   . Sleep apnea    no longer has it after gastric bypass  . Vitamin D deficiency     Past Surgical History:  Procedure Laterality Date  . CHOLECYSTECTOMY    . COLONOSCOPY W/ BIOPSIES AND POLYPECTOMY    . GASTRIC BYPASS    . HERNIA REPAIR    . MULTIPLE TOOTH EXTRACTIONS      Family History  Problem Relation Age of Onset  . Diabetes Other    Social History:  reports that he has been smoking Cigarettes.  He has been smoking about 1.00 pack per day. He has never used smokeless tobacco. He reports that he does not drink alcohol or use drugs.  Allergies:  Allergies  Allergen Reactions  . Penicillins Rash    Medications Prior to Admission  Medication Sig Dispense Refill  . amitriptyline (ELAVIL) 75 MG tablet Take 75 mg by mouth at bedtime.    . Ascorbic Acid (VITAMIN C) 1000 MG tablet Take 1,000 mg by mouth daily.    . Biotin 1 MG CAPS Take 1 capsule by mouth  daily.    Marland Kitchen buPROPion (WELLBUTRIN SR) 150 MG 12 hr tablet Take 150 mg by mouth 2 (two) times daily.    . cetirizine (ZYRTEC) 10 MG tablet Take 10 mg by mouth at bedtime.    . cholecalciferol (VITAMIN D) 1000 units tablet Take 1,000 Units by mouth daily.    . DULoxetine (CYMBALTA) 60 MG capsule Take 60 mg by mouth daily.    . ferrous sulfate 325 (65 FE) MG tablet Take 325 mg by mouth daily with breakfast.    . furosemide (LASIX) 40 MG tablet Take 40 mg by mouth daily as needed for edema.    . gabapentin (NEURONTIN) 300 MG capsule Take 900 mg by mouth 2 (two) times daily.    Marland Kitchen loratadine (CLARITIN) 10 MG tablet Take 10 mg by mouth daily.    . Multiple Vitamins-Minerals (MULTIVITAMIN WITH MINERALS) tablet Take 1 tablet by mouth daily.    . pantoprazole (PROTONIX) 40 MG tablet Take 40 mg by mouth 2 (two) times daily before a meal.    . vitamin B-12 (CYANOCOBALAMIN) 1000 MCG tablet Take 1,000 mcg by mouth daily.    Marland Kitchen zolpidem (AMBIEN) 5 MG tablet Take 5 mg by mouth at bedtime as  needed for sleep.      Results for orders placed or performed during the hospital encounter of 07/21/17 (from the past 48 hour(s))  Comprehensive metabolic panel     Status: Abnormal   Collection Time: 07/21/17  2:03 PM  Result Value Ref Range   Sodium 138 135 - 145 mmol/L   Potassium 3.8 3.5 - 5.1 mmol/L   Chloride 106 101 - 111 mmol/L   CO2 24 22 - 32 mmol/L   Glucose, Bld 86 65 - 99 mg/dL   BUN <5 (L) 6 - 20 mg/dL   Creatinine, Ser 0.94 0.61 - 1.24 mg/dL   Calcium 8.3 (L) 8.9 - 10.3 mg/dL   Total Protein 5.2 (L) 6.5 - 8.1 g/dL   Albumin 2.9 (L) 3.5 - 5.0 g/dL   AST 27 15 - 41 U/L   ALT 29 17 - 63 U/L   Alkaline Phosphatase 186 (H) 38 - 126 U/L   Total Bilirubin 0.6 0.3 - 1.2 mg/dL   GFR calc non Af Amer >60 >60 mL/min   GFR calc Af Amer >60 >60 mL/min    Comment: (NOTE) The eGFR has been calculated using the CKD EPI equation. This calculation has not been validated in all clinical situations. eGFR's  persistently <60 mL/min signify possible Chronic Kidney Disease.    Anion gap 8 5 - 15  CBC     Status: Abnormal   Collection Time: 07/21/17  2:03 PM  Result Value Ref Range   WBC 6.5 4.0 - 10.5 K/uL   RBC 4.15 (L) 4.22 - 5.81 MIL/uL   Hemoglobin 12.8 (L) 13.0 - 17.0 g/dL   HCT 38.2 (L) 39.0 - 52.0 %   MCV 92.0 78.0 - 100.0 fL   MCH 30.8 26.0 - 34.0 pg   MCHC 33.5 30.0 - 36.0 g/dL   RDW 12.9 11.5 - 15.5 %   Platelets 231 150 - 400 K/uL  PT- INR Day of Surgery     Status: None   Collection Time: 07/21/17  2:03 PM  Result Value Ref Range   Prothrombin Time 13.4 11.4 - 15.2 seconds   INR 1.03   Glucose, capillary     Status: None   Collection Time: 07/21/17  2:20 PM  Result Value Ref Range   Glucose-Capillary 83 65 - 99 mg/dL  Glucose, capillary     Status: None   Collection Time: 07/21/17  4:28 PM  Result Value Ref Range   Glucose-Capillary 81 65 - 99 mg/dL   No results found.  Review of Systems  Constitutional: Negative.   HENT: Negative.   Eyes: Negative.   Respiratory: Negative.   Gastrointestinal: Negative.   Skin: Negative.     Blood pressure (!) 143/85, pulse 66, temperature 97.8 F (36.6 C), temperature source Oral, resp. rate 18, height 6' 1"  (1.854 m), weight (!) 145.2 kg (320 lb). Physical Exam  Left displaced distal radius fracture NVI see OV The patient is alert and oriented in no acute distress. The patient complains of pain in the affected upper extremity.  The patient is noted to have a normal HEENT exam. Lung fields show equal chest expansion and no shortness of breath. Abdomen exam is nontender without distention. Lower extremity examination does not show any fracture dislocation or blood clot symptoms. Pelvis is stable and the neck and back are stable and nontender. Assessment/Plan Plan ORIF left distal radius fracture We are planning surgery for your upper extremity. The risk and benefits of surgery to include risk  of bleeding, infection,  anesthesia,  damage to normal structures and failure of the surgery to accomplish its intended goals of relieving symptoms and restoring function have been discussed in detail. With this in mind we plan to proceed. I have specifically discussed with the patient the pre-and postoperative regime and the dos and don'ts and risk and benefits in great detail. Risk and benefits of surgery also include risk of dystrophy(CRPS), chronic nerve pain, failure of the healing process to go onto completion and other inherent risks of surgery The relavent the pathophysiology of the disease/injury process, as well as the alternatives for treatment and postoperative course of action has been discussed in great detail with the patient who desires to proceed.  We will do everything in our power to help you (the patient) restore function to the upper extremity. It is a pleasure to see this patient today.   Paulene Floor, MD 07/21/2017, 7:00 PM

## 2017-07-21 NOTE — Transfer of Care (Signed)
Immediate Anesthesia Transfer of Care Note  Patient: Nicholas Reid  Procedure(s) Performed: LEFT WRIST OPEN REDUCTION INTERNAL FIXATION AND REPAIR RECONSTRUCTION AS NECESSARY (Left Arm Lower)  Patient Location: PACU  Anesthesia Type:MAC and Regional  Level of Consciousness: awake, alert  and oriented  Airway & Oxygen Therapy: Patient Spontanous Breathing and Patient connected to nasal cannula oxygen  Post-op Assessment: Report given to RN and Post -op Vital signs reviewed and stable  Post vital signs: Reviewed and stable  Last Vitals:  Vitals:   07/21/17 1407  BP: (!) 143/85  Pulse: 66  Resp: 18  Temp: 36.6 C    Last Pain:  Vitals:   07/21/17 1417  TempSrc:   PainSc: 6       Patients Stated Pain Goal: 4 (84/78/41 2820)  Complications: No apparent anesthesia complications

## 2017-07-22 ENCOUNTER — Encounter (HOSPITAL_COMMUNITY): Payer: Self-pay | Admitting: General Practice

## 2017-07-22 DIAGNOSIS — S52572A Other intraarticular fracture of lower end of left radius, initial encounter for closed fracture: Secondary | ICD-10-CM | POA: Diagnosis not present

## 2017-07-22 NOTE — Progress Notes (Signed)
Patient discharged from unit to pvt auto home with family. No distress noted, no c/o of. Discharge instructions reviewed with patient and instructions to follow up with Dr. Amanda Pea in 12 days. Pt to pick up rx at drug store. All personal belongings with pt

## 2017-07-22 NOTE — Progress Notes (Signed)
PT Cancellation/Discharge Note  Patient Details Name: Doniven Vanpatten MRN: [id number] DOB: 1966/09/19   Cancelled Treatment:    Reason Eval/Treat Not Completed: PT screened, no needs identified, will sign off; OT reports pt mobilizing well and currently without PT needs.  Please send new order if PT needs arise.  Thanks   Elray Mcgregor 07/22/2017, 9:07 AM Sheran Lawless, PT [phone] 07/22/2017

## 2017-07-22 NOTE — Evaluation (Signed)
Occupational Therapy Evaluation Patient Details Name: Nicholas Reid MRN: [id number] DOB: [birth year] Today's Date: 07/22/2017    History of Present Illness Pt is a 51 y/o M who sustained L distal radius and ulna fracture, now s/p L wrist ORIF on 07/21/17.    Clinical Impression   This 51 y/o M presents with the above. At baseline Pt is independent with ADLS and functional mobility, lives alone. Pt completed room and hallway level functional mobility with supervision this session, currently requires overall MinGuard assist for ADLS, limited by LUE functional deficits/immobilization. Education provided on edema reduction techniques, LUE P/AROM HEP and compensatory techniques for completing ADLs after return home with Pt verbalizing/demonstrating good understanding. Feel Pt will safely return home with follow up therapies as ordered by MD. No further acute OT needs identified at this time. Will sign off.     Follow Up Recommendations  DC plan and follow up therapy as arranged by surgeon;Supervision - Intermittent    Equipment Recommendations  None recommended by OT           Precautions / Restrictions Precautions Precautions: None Restrictions Weight Bearing Restrictions: No      Mobility Bed Mobility Overal bed mobility: Modified Independent             General bed mobility comments: HOB elevated, no difficulty   Transfers Overall transfer level: Modified independent                    Balance Overall balance assessment: No apparent balance deficits (not formally assessed)                                         ADL either performed or assessed with clinical judgement   ADL Overall ADL's : Needs assistance/impaired Eating/Feeding: Set up;Sitting   Grooming: Set up;Standing   Upper Body Bathing: Min guard;Sitting   Lower Body Bathing: Min guard;Sit to/from stand   Upper Body Dressing : Min guard;Sitting   Lower Body Dressing: Min guard;Sit  to/from stand   Toilet Transfer: Min guard;Ambulation;Regular Toilet;Grab bars   Toileting- Clothing Manipulation and Hygiene: Min guard;Sit to/from stand       Functional mobility during ADLs: Min guard General ADL Comments: educated on safety, compensatory techniques for completion of ADLs and functional mobility transfers; educated on edema reduction techniques and elevation of LUE                          Pertinent Vitals/Pain Pain Assessment: 0-10 Pain Score: 9  Pain Descriptors / Indicators: Aching;Sore;Grimacing Pain Intervention(s): Monitored during session;Limited activity within patient's tolerance;Repositioned;RN gave pain meds during session     Hand Dominance Right   Extremity/Trunk Assessment Upper Extremity Assessment Upper Extremity Assessment: LUE deficits/detail LUE Deficits / Details: Pt with painful digit ROM; shoulder AROM WFL  LUE: Unable to fully assess due to immobilization   Lower Extremity Assessment Lower Extremity Assessment: Overall WFL for tasks assessed       Communication Communication Communication: No difficulties   Cognition Arousal/Alertness: Awake/alert Behavior During Therapy: WFL for tasks assessed/performed Overall Cognitive Status: Within Functional Limits for tasks assessed                                           Exercises  General Exercises - Upper Extremity Shoulder Flexion: AROM;5 reps;Supine;Left Shoulder Extension: AROM;5 reps;Supine;Left Shoulder ABduction: AROM;5 reps;Supine;Left Shoulder ADduction: AROM;5 reps;Supine;Left Digit Composite Flexion: PROM;Supine;Left Composite Extension: PROM;Supine;Left Other Exercises Other Exercises: educated on elevating LUE, edema reduction techniques, and completing PROM to L digits using RUE to assist; MD present during session and educating on retrograde massage techniques           Home Living Family/patient expects to be discharged to:: Private  residence Living Arrangements: Alone Available Help at Discharge: Family;Available PRN/intermittently Type of Home: Mobile home Home Access: Stairs to enter Entrance Stairs-Number of Steps: 2 Entrance Stairs-Rails: Right Home Layout: One level     Bathroom Shower/Tub: Chief Strategy Officer: Standard                Prior Functioning/Environment Level of Independence: Independent        Comments: works as a Forensic scientist Problem List: Decreased strength;Impaired UE functional use;Impaired sensation;Decreased range of motion;Pain;Increased edema            OT Goals(Current goals can be found in the care plan section) Acute Rehab OT Goals Patient Stated Goal: return home; less pain  OT Goal Formulation: All assessment and education complete, DC therapy                                 AM-PAC PT "6 Clicks" Daily Activity     Outcome Measure Help from another person eating meals?: None Help from another person taking care of personal grooming?: A Little Help from another person toileting, which includes using toliet, bedpan, or urinal?: None Help from another person bathing (including washing, rinsing, drying)?: A Little Help from another person to put on and taking off regular upper body clothing?: None Help from another person to put on and taking off regular lower body clothing?: A Little 6 Click Score: 21   End of Session Nurse Communication: Mobility status  Activity Tolerance: Patient tolerated treatment well Patient left: in chair;with call bell/phone within reach;with nursing/sitter in room  OT Visit Diagnosis: Muscle weakness (generalized) (M62.81);Pain Pain - Right/Left: Left Pain - part of body: Arm;Hand                Time: 1610-9604 OT Time Calculation (min): 39 min Charges:  OT General Charges $OT Visit: 1 Visit OT Evaluation $OT Eval Low Complexity: 1 Low OT Treatments $Self Care/Home Management : 8-22  mins G-Codes: OT G-codes **NOT FOR INPATIENT CLASS** Functional Assessment Tool Used: AM-PAC 6 Clicks Daily Activity;Clinical judgement Functional Limitation: Self care Self Care Current Status (V4098): At least 1 percent but less than 20 percent impaired, limited or restricted Self Care Goal Status (J1914): At least 1 percent but less than 20 percent impaired, limited or restricted Self Care Discharge Status [phone]): At least 1 percent but less than 20 percent impaired, limited or restricted   Marcy Siren, OT Pager [phone] 07/22/2017  Nicholas Reid 07/22/2017, 9:10 AM

## 2017-07-22 NOTE — Op Note (Signed)
NAMEBRADLEE, [name]:  [id number]  MEDICAL RECORD NO.:  [id number]  LOCATION:                                 FACILITY:  PHYSICIAN:  Dionne Ano. Amanda Pea, M.D.     DATE OF BIRTH:  DATE OF PROCEDURE:  07/21/2017 DATE OF DISCHARGE:                              OPERATIVE REPORT   PREOPERATIVE DIAGNOSIS:  Comminuted complex intra-articular greater than 5-part left distal radius fracture.  POSTOPERATIVE DIAGNOSIS:  Comminuted complex intra-articular greater than 5-part left distal radius fracture.  PROCEDURES: 1. Open reduction and internal fixation of greater than 5-part intra-     articular distal radius fracture with extended volar rim plate from     [employer]. 2. Four-view radiographic series performed, examined, and interpreted     by myself. 3. Closed treatment of ulnar styloid fracture.  SURGEON:  Dionne Ano. Amanda Pea, M.D.  ASSISTANT:  Karie Chimera, P.A.-C.  COMPLICATIONS:  None.  ANESTHESIA:  Regional block with IV sedation.  TOURNIQUET TIME:  Less than 45 minutes.  DRAINS:  One.  INDICATIONS:  This patient is a 51 year old man who has a history of bariatric surgery as well as an on-the-job injury with the above- mentioned injury process to his left distal radius.  He was provisionally stabilized and sent for definitive care.  He understands risks and benefits of surgery and desires to proceed with surgical avenues of care as I have described.  OPERATIVE PROCEDURE:  The patient was seen by myself and Anesthesia, taken to the operative theater, underwent smooth induction of IV sedation as the regional anesthetic was working quite nicely.  I prescrubbed him with a Hibiclens scrub followed by 10 minutes of surgical Betadine scrub that I held and performed myself given the fracture fragility.  Following this, I made sure that we isolated him to a sterile field, called a final time-out, and performed a volar radial incision about the distal  radius.  Dissection was carried down.  FCR tendon sheath was incised palmarly and dorsally.  Carpal canal contents retracted ulnarly and following this, the fracture was accessed. Pronator was incised and following this, we then performed open reduction and internal fixation.  Provisional fixation was held with K- wire.  I utilized a traction technique with fingertrap traction of 10 pounds and then applied a distal radius DVR extended volar rim plate from [employer].  I was able to achieve adequate height inclination, volar tilt, and satisfactory geometric parameters.  I was pleased with this and following this, I checked all screw lengths, all looked well.  Final copy x-rays were taken and a 4-view radiographic series was performed, examined and interpreted by myself.  Following this, I performed closure of the pronator with Vicryl.  A drain was placed.  Tourniquet deflated and closure of the skin edge was performed with Prolene.  Excellent refill of soft compartments was noted.  I performed an extensive evaluation of the distal ulna styloid and this was stable despite the fracture towards the base.  The TFC region appeared to be very stable.  There was no locking, popping, catching or instability about the distal radioulnar joint as examined in neutral pronation and  supination, I was pleased with this.  We then placed him in a sterile dressing and a standard sugar-tong splint.  We will see him back in the office in 10 to 14 days and move forward with our standard DVR protocol.  These notes have been discussed.  He will be admitted for IV antibiotics, general postop observation, and pain management.  All questions have been encouraged and answered.  It was a pleasure participating in his care.     Dionne Ano. Amanda Pea, M.D.   ______________________________ Dionne Ano. Amanda Pea, M.D.    [facility]  D:  07/21/2017  T:  07/22/2017  Job:  [id number]

## 2017-07-22 NOTE — Anesthesia Postprocedure Evaluation (Signed)
Anesthesia Post Note  Patient: Nicholas Reid  Procedure(s) Performed: LEFT WRIST OPEN REDUCTION INTERNAL FIXATION AND REPAIR RECONSTRUCTION AS NECESSARY (Left Arm Lower)     Patient location during evaluation: PACU Anesthesia Type: Regional Level of consciousness: awake and alert Pain management: pain level controlled Vital Signs Assessment: post-procedure vital signs reviewed and stable Respiratory status: spontaneous breathing Cardiovascular status: stable Anesthetic complications: no    Last Vitals:  Vitals:   07/21/17 2040 07/21/17 2053  BP: 134/90 130/82  Pulse:    Resp:  16  Temp:  (!) 36.2 C  SpO2:  96%    Last Pain:  Vitals:   07/21/17 2053  TempSrc:   PainSc: 0-No pain                 Lewie Loron

## 2017-07-22 NOTE — Discharge Summary (Signed)
Physician Discharge Summary  Patient ID: Nicholas Reid MRN: [id number] DOB/AGE: [birth year] 51 y.o.  Admit date: 07/21/2017 Discharge date: 07/22/2017  Admission Diagnoses: Left wrist fracture Past Medical History:  Diagnosis Date  . Abnormal liver enzymes    elevated AST  . Anemia   . BPH (benign prostatic hyperplasia)   . Complication of anesthesia    " When I wake up, I am really mad."  . Depression   . Diabetes mellitus without complication (HCC)    PMH  . Distal radius fracture, left   . Fatty liver    PMH  . GERD (gastroesophageal reflux disease)   . Hemorrhoids   . History of hiatal hernia    PMH  . Hypercholesterolemia   . Hypoalbuminemia   . Hypothyroidism   . Intestinal malabsorption following gastrectomy   . Low back pain   . Obesity   . Panic attack   . Seasonal allergies   . Sinusitis   . Sleep apnea    no longer has it after gastric bypass  . Vitamin D deficiency     Discharge Diagnoses:  Active Problems:   Closed fracture of left distal radius and ulna  Patient Active Problem List   Diagnosis Date Noted  . Closed fracture of left distal radius and ulna 07/21/2017    Surgeries: Procedure(s): LEFT WRIST OPEN REDUCTION INTERNAL FIXATION AND REPAIR RECONSTRUCTION AS NECESSARY on 07/21/2017    Consultants: none  Discharged Condition: Improved  Hospital Course: Nicholas Reid is an 51 y.o. male who was admitted 07/21/2017 with a chief complaint of No chief complaint on file. , and found to have a diagnosis of Left wrist fracture.  They were brought to the operating room on 07/21/2017 and underwent Procedure(s): LEFT WRIST OPEN REDUCTION INTERNAL FIXATION AND REPAIR RECONSTRUCTION AS NECESSARY.  The patient underwent to the affirmation procedure and did quite well. He was admitted for close observation, IV antibiotics and pain management. Postoperative day #1 he was doing fairly well. He had pain appropriate to his surgery. He denied any nausea, vomiting, fever or  chills. We spent a great at bedtime with him going over elevation, edema control and finger range of motion. You discussed with him discharged today and following up in our office setting in approximately 2 weeks for cast change and repeat radiographs. The patient has previously been given his prescriptions in our office setting.  They were given perioperative antibiotics: Anti-infectives    Start     Dose/Rate Route Frequency Ordered Stop   07/22/17 0600  ceFAZolin (ANCEF) IVPB 2g/100 mL premix  Status:  Discontinued     2 g 200 mL/hr over 30 Minutes Intravenous On call to O.R. 07/21/17 1359 07/21/17 1413   07/22/17 0600  ceFAZolin (ANCEF) 3 g in dextrose 5 % 50 mL IVPB  Status:  Discontinued     3 g 130 mL/hr over 30 Minutes Intravenous On call to O.R. 07/21/17 1413 07/21/17 1444   07/21/17 2230  vancomycin (VANCOCIN) 1,250 mg in sodium chloride 0.9 % 250 mL IVPB     1,250 mg 166.7 mL/hr over 90 Minutes Intravenous Every 12 hours 07/21/17 2144     07/21/17 1445  ceFAZolin (ANCEF) 3 g in dextrose 5 % 50 mL IVPB     3 g 130 mL/hr over 30 Minutes Intravenous To ShortStay [facility] 07/21/17 1444 07/21/17 1905   07/21/17 1358  ceFAZolin (ANCEF) 2-4 GM/100ML-% IVPB  Status:  Discontinued    Comments:  Scronce, Trina   :  cabinet override      07/21/17 1358 07/21/17 1418    .  They were given sequential compression devices, early ambulation,   for DVT prophylaxis.  Recent vital signs: Patient Vitals for the past 24 hrs:  BP Temp Temp src Pulse Resp SpO2 Height Weight  07/22/17 0500 121/81 98.2 F (36.8 C) Oral 70 18 96 % - -  07/22/17 0000 135/85 98.1 F (36.7 C) Oral 61 18 96 % - -  07/21/17 2053 130/82 (!) 97.2 F (36.2 C) - - 16 96 % - -  07/21/17 2040 134/90 - - - - - - -  07/21/17 2038 - - - (!) 58 15 100 % - -  07/21/17 2028 - (!) 97.5 F (36.4 C) - 67 17 97 % - -  07/21/17 1407 (!) 143/85 97.8 F (36.6 C) Oral 66 18 -  (1.854 m) (!) 145.2 kg (320 lb)  .  Recent laboratory  studies: No results found.  Discharge Medications:   Allergies as of 07/22/2017      Reactions   Penicillins Rash      Medication List    TAKE these medications   amitriptyline 75 MG tablet Commonly known as:  ELAVIL Take 75 mg by mouth at bedtime.   Biotin 1 MG Caps Take 1 capsule by mouth daily.   buPROPion 150 MG 12 hr tablet Commonly known as:  WELLBUTRIN SR Take 150 mg by mouth 2 (two) times daily.   cetirizine 10 MG tablet Commonly known as:  ZYRTEC Take 10 mg by mouth at bedtime.   cholecalciferol 1000 units tablet Commonly known as:  VITAMIN D Take 1,000 Units by mouth daily.   DULoxetine 60 MG capsule Commonly known as:  CYMBALTA Take 60 mg by mouth daily.   ferrous sulfate 325 (65 FE) MG tablet Take 325 mg by mouth daily with breakfast.   furosemide 40 MG tablet Commonly known as:  LASIX Take 40 mg by mouth daily as needed for edema.   gabapentin 300 MG capsule Commonly known as:  NEURONTIN Take 900 mg by mouth 2 (two) times daily.   loratadine 10 MG tablet Commonly known as:  CLARITIN Take 10 mg by mouth daily.   multivitamin with minerals tablet Take 1 tablet by mouth daily.   pantoprazole 40 MG tablet Commonly known as:  PROTONIX Take 40 mg by mouth 2 (two) times daily before a meal.   vitamin B-12 1000 MCG tablet Commonly known as:  CYANOCOBALAMIN Take 1,000 mcg by mouth daily.   vitamin C 1000 MG tablet Take 1,000 mg by mouth daily.   zolpidem 5 MG tablet Commonly known as:  AMBIEN Take 5 mg by mouth at bedtime as needed for sleep.       Diagnostic Studies: No results found.  They benefited maximally from their hospital stay and there were no complications.     Disposition: Final discharge disposition not confirmed Discharge Instructions    Call MD / Call 911    Complete by:  As directed    If you experience chest pain or shortness of breath, CALL 911 and be transported to the hospital emergency room.  If you develope a  fever above 101 F, pus (white drainage) or increased drainage or redness at the wound, or calf pain, call your surgeon's office.   Constipation Prevention    Complete by:  As directed    Drink plenty of fluids.  Prune juice may be helpful.  You may use  a stool softener, such as Colace (over the counter) 100 mg twice a day.  Use MiraLax (over the counter) for constipation as needed.   Diet - low sodium heart healthy    Complete by:  As directed    Discharge instructions    Complete by:  As directed    Keep bandage clean and dry.  Call for any problems.  No smoking.  Criteria for driving a car: you should be off your pain medicine for 7-8 hours, able to drive one handed(confident), thinking clearly and feeling able in your judgement to drive. Continue elevation as it will decrease swelling.  If instructed by MD move your fingers within the confines of the bandage/splint.  Use ice if instructed by your MD. Call immediately for any sudden loss of feeling in your hand/arm or change in functional abilities of the extremity.We recommend that you to take vitamin C 1000 mg a day to promote healing. We also recommend that if you require  pain medicine that you take a stool softener to prevent constipation as most pain medicines will have constipation side effects. We recommend either Peri-Colace or Senokot and recommend that you also consider adding MiraLAX as well to prevent the constipation affects from pain medicine if you are required to use them. These medicines are over the counter and may be purchased at a local pharmacy. A cup of yogurt and a probiotic can also be helpful during the recovery process as the medicines can disrupt your intestinal environment.   Increase activity slowly as tolerated    Complete by:  As directed      Follow-up Information    Dominica Severin, MD Follow up in 12 day(s).   Specialty:  Orthopedic Surgery Why:  our office will contact you with appointment date and  time Contact information: [address] [place] [address] [phone]            Signed: Sheran Lawless 07/22/2017, 8:52 AM

## 2017-07-22 NOTE — Discharge Instructions (Signed)

## 2023-01-12 DEATH — deceased
